# Patient Record
Sex: Male | Born: 1995 | Race: Black or African American | Hispanic: No | Marital: Single | Smoking: Never smoker
Health system: Southern US, Community
[De-identification: ages and names within clinical notes are randomized; demographics above are authoritative.]

## PROBLEM LIST (undated history)

## (undated) ENCOUNTER — Emergency Department: Payer: Self-pay | Source: Home / Self Care

## (undated) DIAGNOSIS — J45909 Unspecified asthma, uncomplicated: Secondary | ICD-10-CM

---
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
## 2017-11-23 ENCOUNTER — Encounter (HOSPITAL_COMMUNITY): Payer: Self-pay | Admitting: Emergency Medicine

## 2017-11-23 ENCOUNTER — Ambulatory Visit (HOSPITAL_COMMUNITY)
Admission: EM | Admit: 2017-11-23 | Discharge: 2017-11-23 | Disposition: A | Discharge status: Home / Self Care | Payer: Self-pay | Attending: Family | Admitting: Family

## 2017-11-23 DIAGNOSIS — R1084 Generalized abdominal pain: Secondary | ICD-10-CM

## 2017-11-23 MED ORDER — HYOSCYAMINE SULFATE 0.125 MG PO TABS: 0.1250 mg | ORAL_TABLET | Freq: Four times a day (QID) | ORAL | 0 refills | Status: DC | PRN

## 2017-11-23 NOTE — ED Triage Notes (Signed)
Pt c/o generalized abdominal pain since Monday. Denies n/v, states he had one episode of diarrhea. Pt denies issues with urination.

## 2017-11-23 NOTE — ED Provider Notes (Signed)
[facility]    CSN: [id number] Arrival date & time: 11/23/17  1205     History   Chief Complaint Chief Complaint  Patient presents with  . Abdominal Pain    HPI Peter Maxwell is a 22 y.o. male.   22 year old male presents with generalized abdominal pain for the past 4 to 5 days. Was worse yesterday evening after eating buttered popcorn. Denies any fever, nausea, vomiting, dysuria or URI symptoms. Had one episode of diarrhea but had a normal bowel movement yesterday. Symptoms improve slightly with eating except for last evening. Has not tried any medications for symptoms. No other chronic health issues except asthma. Takes no daily medications.   The history is provided by the patient.    Past Medical History:  Diagnosis Date  . Asthma     There are no active problems to display for this patient.   History reviewed. No pertinent surgical history.     Home Medications    Prior to Admission medications   Medication Sig Start Date End Date Taking? Authorizing Provider  hyoscyamine (LEVSIN, ANASPAZ) 0.125 MG tablet Take 1 tablet (0.125 mg total) by mouth every 6 (six) hours as needed for cramping. 11/23/17   Sudie Grumbling, NP    Family History No family history on file.  Social History Social History   Tobacco Use  . Smoking status: Never Smoker  . Smokeless tobacco: Never Used  Substance Use Topics  . Alcohol use: No  . Drug use: No     Allergies   Patient has no known allergies.   Review of Systems Review of Systems  Constitutional: Negative for activity change, appetite change, chills, fatigue and fever.  HENT: Negative for congestion, mouth sores, postnasal drip, rhinorrhea, sinus pressure, sinus pain, sneezing, sore throat and trouble swallowing.   Respiratory: Negative for cough, shortness of breath and wheezing.   Gastrointestinal: Positive for abdominal pain and diarrhea (one episode). Negative for blood in stool, constipation,  nausea and vomiting.  Genitourinary: Negative for decreased urine volume, difficulty urinating, discharge, dysuria, flank pain, genital sores, hematuria, scrotal swelling, testicular pain and urgency.  Musculoskeletal: Negative for arthralgias, back pain and myalgias.  Skin: Negative for rash and wound.  Allergic/Immunologic: Negative for immunocompromised state.  Neurological: Negative for dizziness, seizures, syncope, weakness, light-headedness and headaches.  Hematological: Negative for adenopathy. Does not bruise/bleed easily.  Psychiatric/Behavioral: Negative.      Physical Exam Triage Vital Signs ED Triage Vitals  Enc Vitals Group     BP 11/23/17 1237 (!) 105/58     Pulse Rate 11/23/17 1237 71     Resp 11/23/17 1237 16     Temp 11/23/17 1237 98.1 F (36.7 C)     Temp src --      SpO2 11/23/17 1237 100 %     Weight --      Height --      Head Circumference --      Peak Flow --      Pain Score 11/23/17 1238 8     Pain Loc --      Pain Edu? --      Excl. in GC? --    No data found.  Updated Vital Signs BP (!) 105/58   Pulse 71   Temp 98.1 F (36.7 C)   Resp 16   SpO2 100%   Visual Acuity Right Eye Distance:   Left Eye Distance:   Bilateral Distance:    Right Eye  Near:   Left Eye Near:    Bilateral Near:     Physical Exam  Constitutional: He is oriented to person, place, and time. He appears well-developed and well-nourished. He does not appear ill. No distress.  HENT:  Head: Normocephalic and atraumatic.  Right Ear: Hearing and external ear normal.  Left Ear: Hearing and external ear normal.  Nose: Nose normal.  Mouth/Throat: Uvula is midline, oropharynx is clear and moist and mucous membranes are normal.  Eyes: Pupils are equal, round, and reactive to light. Conjunctivae and EOM are normal. No scleral icterus.  Neck: Normal range of motion. Neck supple.  Cardiovascular: Normal rate, regular rhythm, normal heart sounds and intact distal pulses.  No  murmur heard. Pulmonary/Chest: Effort normal and breath sounds normal. No stridor. No respiratory distress. He has no wheezes. He has no rhonchi. He has no rales.  Abdominal: Soft. Normal appearance. He exhibits no distension, no fluid wave, no abdominal bruit, no pulsatile midline mass and no mass. Bowel sounds are increased. There is no hepatosplenomegaly. There is generalized tenderness. There is no rigidity, no rebound, no guarding and no CVA tenderness.  Musculoskeletal: Normal range of motion.  Lymphadenopathy:    He has no cervical adenopathy.  Neurological: He is alert and oriented to person, place, and time.  Skin: Skin is warm and dry. No rash noted.  Psychiatric: He has a normal mood and affect. His behavior is normal.  Vitals reviewed.    UC Treatments / Results  Labs (all labs ordered are listed, but only abnormal results are displayed) Labs Reviewed - No data to display  EKG None  Radiology No results found.  Procedures Procedures (including critical care time)  Medications Ordered in UC Medications - No data to display  Initial Impression / Assessment and Plan / UC Course  I have reviewed the triage vital signs and the nursing notes.  Pertinent labs & imaging results that were available during my care of the patient were reviewed by me and considered in my medical decision making (see chart for details).    Discussed that he may have a viral illness or mild irritable bowel. He declined any medication today but would like a written Rx if pain does not improve in the next 48 hours. If pain persists, may take Levsin as directed. Continue to increase fluids and eat a bland diet and advance as tolerated. Note written for work for today. Follow-up here in 3 days if not improving or go to the ER if symptoms worsen.   Final Clinical Impressions(s) / UC Diagnoses   Final diagnoses:  Generalized abdominal pain     Discharge Instructions     Recommend eat soft,  bland foods today. May increase fluid intake. Avoid fried or greasy foods. If pain continues or gets worse later today or tomorrow, may start Levsin 1 tablet every 6 hours as needed. Recommend follow-up here in 3 to 4 days if not improving or go to the ER if symptoms worsen.     ED Prescriptions    Medication Sig Dispense Auth. Provider   hyoscyamine (LEVSIN, ANASPAZ) 0.125 MG tablet Take 1 tablet (0.125 mg total) by mouth every 6 (six) hours as needed for cramping. 20 tablet Sudie Grumbling, NP     Controlled Substance Prescriptions [place] Controlled Substance Registry consulted? Not Applicable   Sudie Grumbling, NP 11/23/17 2228

## 2017-11-23 NOTE — Discharge Instructions (Addendum)
Recommend eat soft, bland foods today. May increase fluid intake. Avoid fried or greasy foods. If pain continues or gets worse later today or tomorrow, may start Levsin 1 tablet every 6 hours as needed. Recommend follow-up here in 3 to 4 days if not improving or go to the ER if symptoms worsen.

## 2017-12-11 ENCOUNTER — Ambulatory Visit: Payer: Medicaid - Out of State | Admitting: Family Medicine

## 2018-01-19 ENCOUNTER — Encounter (HOSPITAL_COMMUNITY): Payer: Self-pay | Admitting: Emergency Medicine

## 2018-01-19 ENCOUNTER — Emergency Department (HOSPITAL_COMMUNITY)
Admission: EM | Admit: 2018-01-19 | Discharge: 2018-01-19 | Disposition: A | Discharge status: Home / Self Care | Payer: Medicaid - Out of State | Attending: Emergency Medicine | Admitting: Emergency Medicine

## 2018-01-19 DIAGNOSIS — M79674 Pain in right toe(s): Secondary | ICD-10-CM | POA: Insufficient documentation

## 2018-01-19 DIAGNOSIS — J45909 Unspecified asthma, uncomplicated: Secondary | ICD-10-CM | POA: Insufficient documentation

## 2018-01-19 MED ORDER — IBUPROFEN 800 MG PO TABS: 800.0000 mg | ORAL_TABLET | Freq: Three times a day (TID) | ORAL | 0 refills | Status: DC

## 2018-01-19 NOTE — ED Notes (Signed)
Patient Alert and oriented to baseline. Stable and ambulatory to baseline. Patient verbalized understanding of the discharge instructions.  Patient belongings were taken by the patient.   

## 2018-01-19 NOTE — Discharge Instructions (Signed)
You were seen in the emergency department today for pain to your right second toe.  It appears that you have a callus.  Please take the ibuprofen 800 mg every 8 hours as needed for pain and swelling.  Take this with food as it can cause stomach upset and it were stomach bleeding.  Please take Tylenol per over-the-counter dosing with this medication.    We have prescribed you new medication(s) today. Discuss the medications prescribed today with your pharmacist as they can have adverse effects and interactions with your other medicines including over the counter and prescribed medications. Seek medical evaluation if you start to experience new or abnormal symptoms after taking one of these medicines, seek care immediately if you start to experience difficulty breathing, feeling of your throat closing, facial swelling, or rash as these could be indications of a more serious allergic reaction   Please wear loose fitting shoes or open toed shoes to avoid pressure to this area.  Follow-up with the podiatrist, foot specialist, your discharge instructions sometime next week.  Return to the ER for new or worsening symptoms or any other concerns.

## 2018-01-19 NOTE — ED Notes (Signed)
Patient is A&Ox4 at this time.  Patient in no signs of distress.  Please see providers note for complete history and physical exam.  

## 2018-01-19 NOTE — ED Provider Notes (Signed)
[facility] [id number] [facility] Provider Note   CSN: [id number] Arrival date & time: 01/19/18  2012     History   Chief Complaint Chief Complaint  Patient presents with  . Toe Pain    HPI Peter Maxwell is a 22 y.o. male with a hx of asthma who presents to the ED with complaints of r 2nd toe painful lesion x 1.5 weeks. Patient states that he wore a new pair of shoes just prior to onset of painful somewhat hard area to the medial aspect of his R 2nd toe. Rates pain a 10/10 in severity, worse with pressure to the area, no alleviating factors. No meds PTA. Denies fever, chills, change in color, numbness, or weakness.   HPI  Past Medical History:  Diagnosis Date  . Asthma     There are no active problems to display for this patient.   History reviewed. No pertinent surgical history.      Home Medications    Prior to Admission medications   Medication Sig Start Date End Date Taking? Authorizing Provider  hyoscyamine (LEVSIN, ANASPAZ) 0.125 MG tablet Take 1 tablet (0.125 mg total) by mouth every 6 (six) hours as needed for cramping. 11/23/17   Sudie GrumblingAmyot, Ann Berry, NP    Family History History reviewed. No pertinent family history.  Social History Social History   Tobacco Use  . Smoking status: Never Smoker  . Smokeless tobacco: Never Used  Substance Use Topics  . Alcohol use: No  . Drug use: No     Allergies   Patient has no known allergies.   Review of Systems Review of Systems  Constitutional: Negative for chills and fever.  Skin: Positive for wound. Negative for color change.  Neurological: Negative for weakness and numbness.     Physical Exam Updated Vital Signs BP 139/86 (BP Location: Right Arm)   Pulse 75   Temp 98.9 F (37.2 C) (Oral)   Resp 15   SpO2 100%   Physical Exam  Constitutional: He appears well-developed and well-nourished. No distress.  HENT:  Head: Normocephalic and atraumatic.  Eyes: Conjunctivae are  normal. Right eye exhibits no discharge. Left eye exhibits no discharge.  Cardiovascular:  Pulses:      Dorsalis pedis pulses are 2+ on the right side, and 2+ on the left side.       Posterior tibial pulses are 2+ on the right side, and 2+ on the left side.  Musculoskeletal:  Normal range of motion to bilateral ankles, able to move all digits (toes).  No point/focal bony tenderness.  Neurological: He is alert.  Clear speech.  Sensation grossly intact bilateral lower extremities.  5 out of 5 strength with plantar dorsiflexion bilaterally.  Gait intact.  Skin: Capillary refill takes less than 2 seconds.  Patient has a 0.5 cm area or thickened skin to medial aspect of R 2nd toe. Tender to palpation.  No overlying warmth.  No erythema.  No drainage.  No palpable fluctuance.  Image below.   Psychiatric: He has a normal mood and affect. His behavior is normal. Thought content normal.  Nursing note and vitals reviewed.      ED Treatments / Results  Labs (all labs ordered are listed, but only abnormal results are displayed) Labs Reviewed - No data to display  EKG None  Radiology No results found.  Procedures Procedures (including critical care time)  Medications Ordered in ED Medications - No data to display   Initial Impression / Assessment  and Plan / ED Course  I have reviewed the triage vital signs and the nursing notes.  Pertinent labs & imaging results that were available during my care of the patient were reviewed by me and considered in my medical decision making (see chart for details).   Patient presents with what appears to be painful callus to right second digit, NVI distally. Does not appear to have superimposed infection. Ibuprofen prescription provided, recommended loose fitting shoes, podiatry follow up. I discussed  treatment plan, follow-up, and return precautions with the patient. Provided opportunity for questions, patient confirmed understanding and is in  agreement with plan.    Final Clinical Impressions(s) / ED Diagnoses   Final diagnoses:  Pain of toe of right foot    ED Discharge Orders        Ordered    ibuprofen (ADVIL,MOTRIN) 800 MG tablet  3 times daily     01/19/18 2126       Cherly Anderson, PA-C 01/19/18 2131    Loren Racer, MD 01/22/18 1651

## 2018-01-19 NOTE — ED Triage Notes (Signed)
Pt presents to ED for assessment of large callous to second toe of his right foot after wearing a pair of tight shoes.  Patient states pain unbearable.

## 2018-04-02 ENCOUNTER — Ambulatory Visit: Payer: Medicaid - Out of State | Admitting: Family Medicine

## 2018-04-06 ENCOUNTER — Encounter (HOSPITAL_COMMUNITY): Payer: Self-pay

## 2018-04-06 ENCOUNTER — Other Ambulatory Visit: Payer: Self-pay

## 2018-04-06 ENCOUNTER — Ambulatory Visit (HOSPITAL_COMMUNITY)
Admission: EM | Admit: 2018-04-06 | Discharge: 2018-04-06 | Disposition: A | Discharge status: Home / Self Care | Payer: Self-pay | Attending: Internal Medicine | Admitting: Internal Medicine

## 2018-04-06 DIAGNOSIS — B360 Pityriasis versicolor: Secondary | ICD-10-CM

## 2018-04-06 MED ORDER — SELENIUM SULFIDE 2.25 % EX FOAM: 1.0000 "application " | Freq: Two times a day (BID) | CUTANEOUS | 0 refills | Status: DC

## 2018-04-06 NOTE — Discharge Instructions (Signed)
Symptoms most likely caused by fungal infection Selenium sulfide prescribed.  Use as directed and to completion Please be aware that changes in skin pigmentation can often persists even after successful treatment.  Restoration of normal skin pigmentation may take months after the completion of successful therapy.   Follow up with PCP or with [facility] if symptoms persists Return or go to the ED if you have any new or worsening symptoms

## 2018-04-06 NOTE — ED Provider Notes (Signed)
[facility]   [id number] 04/06/18 Arrival Time: 1550  CC: SKIN COMPLAINT  SUBJECTIVE:  Peter Maxwell is a 22 y.o. male who presents with a rash on neck that began 1 month ago.  Reports dying hair around the time of symptoms.  Denies changes in soaps, detergents, or anyone with similar symptoms.  Denies known trigger, environmental exposure or allergies, or recent travel.  Localizes the rash to right side of neck.  Denies itching or pain.  Has tried antifungal treatment with temporary relief.  Denies worsening symptoms.  Denies similar symptoms in the past.   Denies fever, chills, nausea, vomiting, erythema, redness, swollen glands, SOB, chest pain, abdominal pain, changes in bowel or bladder function.    ROS: As per HPI.  Past Medical History:  Diagnosis Date  . Asthma    History reviewed. No pertinent surgical history. No Known Allergies No current facility-administered medications on file prior to encounter.    Current Outpatient Medications on File Prior to Encounter  Medication Sig Dispense Refill  . hyoscyamine (LEVSIN, ANASPAZ) 0.125 MG tablet Take 1 tablet (0.125 mg total) by mouth every 6 (six) hours as needed for cramping. 20 tablet 0  . ibuprofen (ADVIL,MOTRIN) 800 MG tablet Take 1 tablet (800 mg total) by mouth 3 (three) times daily. 21 tablet 0   Social History   Socioeconomic History  . Marital status: Single    Spouse name: Not on file  . Number of children: Not on file  . Years of education: Not on file  . Highest education level: Not on file  Occupational History  . Not on file  Social Needs  . Financial resource strain: Not on file  . Food insecurity:    Worry: Not on file    Inability: Not on file  . Transportation needs:    Medical: Not on file    Non-medical: Not on file  Tobacco Use  . Smoking status: Never Smoker  . Smokeless tobacco: Never Used  Substance and Sexual Activity  . Alcohol use: No  . Drug use: No  . Sexual activity:  Not on file  Lifestyle  . Physical activity:    Days per week: Not on file    Minutes per session: Not on file  . Stress: Not on file  Relationships  . Social connections:    Talks on phone: Not on file    Gets together: Not on file    Attends religious service: Not on file    Active member of club or organization: Not on file    Attends meetings of clubs or organizations: Not on file    Relationship status: Not on file  . Intimate partner violence:    Fear of current or ex partner: Not on file    Emotionally abused: Not on file    Physically abused: Not on file    Forced sexual activity: Not on file  Other Topics Concern  . Not on file  Social History Narrative  . Not on file   History reviewed. No pertinent family history.  OBJECTIVE: Vitals:   04/06/18 1600 04/06/18 1603  BP: 109/67   Pulse: 73   Resp: 16   Temp: 97.9 F (36.6 C)   TempSrc: Oral   SpO2: 98%   Weight:  130 lb (59 kg)    General appearance: alert; no distress Lungs: clear to auscultation bilaterally Heart: regular rate and rhythm.  Radial pulse 2+ bilaterally Extremities: no edema Skin: warm and dry; flat  hypopigmented macules localized to right posterior neck and superior aspect of shoulder, with involvement of anterior trunk (see pictures below) Psychological: alert and cooperative; normal mood and affect        ASSESSMENT & PLAN:  1. Tinea versicolor     No orders of the defined types were placed in this encounter.  Symptoms most likely caused by fungal infection Selenium sulfide prescribed.  Use as directed and to completion Please be aware that changes in skin pigmentation can often persists even after successful treatment.  Restoration of normal skin pigmentation may take months after the completion of successful therapy.   Follow up with PCP or with [facility] if symptoms persists Return or go to the ED if you have any new or worsening symptoms   Reviewed  expectations re: course of current medical issues. Questions answered. Outlined signs and symptoms indicating need for more acute intervention. Patient verbalized understanding. After Visit Summary given.   Rennis Harding, PA-C 04/06/18 1649

## 2018-04-06 NOTE — ED Triage Notes (Signed)
Pt states he has a rash on his neck x 1 month.

## 2018-07-05 ENCOUNTER — Other Ambulatory Visit: Payer: Self-pay

## 2018-07-05 ENCOUNTER — Emergency Department (HOSPITAL_COMMUNITY): Payer: Self-pay

## 2018-07-05 ENCOUNTER — Emergency Department (HOSPITAL_COMMUNITY)
Admission: EM | Admit: 2018-07-05 | Discharge: 2018-07-05 | Disposition: A | Discharge status: Home / Self Care | Payer: Self-pay | Attending: Emergency Medicine | Admitting: Emergency Medicine

## 2018-07-05 DIAGNOSIS — J45909 Unspecified asthma, uncomplicated: Secondary | ICD-10-CM | POA: Insufficient documentation

## 2018-07-05 DIAGNOSIS — R1084 Generalized abdominal pain: Secondary | ICD-10-CM | POA: Insufficient documentation

## 2018-07-05 LAB — COMPREHENSIVE METABOLIC PANEL
ALBUMIN: 4.3 g/dL (ref 3.5–5.0)
ALT: 14 U/L (ref 0–44)
AST: 19 U/L (ref 15–41)
Alkaline Phosphatase: 42 U/L (ref 38–126)
Anion gap: 12 (ref 5–15)
BUN: 19 mg/dL (ref 6–20)
CHLORIDE: 101 mmol/L (ref 98–111)
CO2: 28 mmol/L (ref 22–32)
CREATININE: 1.19 mg/dL (ref 0.61–1.24)
Calcium: 10.1 mg/dL (ref 8.9–10.3)
GFR calc non Af Amer: >60 mL/min (ref >60)
GLUCOSE: 105 mg/dL — AB (ref 70–99)
Potassium: 3.4 mmol/L — ABNORMAL LOW (ref 3.5–5.1)
SODIUM: 141 mmol/L (ref 135–145)
Total Bilirubin: 0.9 mg/dL (ref 0.3–1.2)
Total Protein: 7.7 g/dL (ref 6.5–8.1)

## 2018-07-05 LAB — CBC WITH DIFFERENTIAL/PLATELET
ABS IMMATURE GRANULOCYTES: 0.01 10*3/uL (ref 0.00–0.07)
BASOS ABS: 0.1 10*3/uL (ref 0.0–0.1)
BASOS PCT: 1 %
Eosinophils Absolute: 0.2 10*3/uL (ref 0.0–0.5)
Eosinophils Relative: 4 %
HCT: 48.8 % (ref 39.0–52.0)
HEMOGLOBIN: 15.1 g/dL (ref 13.0–17.0)
Immature Granulocytes: 0 %
Lymphocytes Relative: 40 %
Lymphs Abs: 2.6 10*3/uL (ref 0.7–4.0)
MCH: 27.4 pg (ref 26.0–34.0)
MCHC: 30.9 g/dL (ref 30.0–36.0)
MCV: 88.4 fL (ref 80.0–100.0)
Monocytes Absolute: 1 10*3/uL (ref 0.1–1.0)
Monocytes Relative: 14 %
NEUTROS ABS: 2.8 10*3/uL (ref 1.7–7.7)
NEUTROS PCT: 41 %
PLATELETS: 293 10*3/uL (ref 150–400)
RBC: 5.52 MIL/uL (ref 4.22–5.81)
RDW: 13 % (ref 11.5–15.5)
WBC: 6.7 10*3/uL (ref 4.0–10.5)
nRBC: 0 % (ref 0.0–0.2)

## 2018-07-05 LAB — LIPASE, BLOOD: Lipase: 35 U/L (ref 11–51)

## 2018-07-05 MED ORDER — SODIUM CHLORIDE 0.9 % IV BOLUS
1000.0000 mL | Freq: Once | INTRAVENOUS | Status: AC
  Administered 2018-07-05: 1000 mL via INTRAVENOUS

## 2018-07-05 MED ORDER — DICYCLOMINE HCL 20 MG PO TABS: 20.0000 mg | ORAL_TABLET | Freq: Three times a day (TID) | ORAL | 0 refills | Status: DC

## 2018-07-05 MED ORDER — IOHEXOL 300 MG/ML  SOLN
100.0000 mL | Freq: Once | INTRAMUSCULAR | Status: AC | PRN
  Administered 2018-07-05: 100 mL via INTRAVENOUS

## 2018-07-05 MED ORDER — ONDANSETRON HCL 4 MG/2ML IJ SOLN
4.0000 mg | Freq: Once | INTRAMUSCULAR | Status: AC
  Administered 2018-07-05: 4 mg via INTRAVENOUS
  Filled 2018-07-05: qty 2

## 2018-07-05 MED ORDER — ONDANSETRON HCL 4 MG PO TABS: 4.0000 mg | ORAL_TABLET | Freq: Four times a day (QID) | ORAL | 0 refills | Status: DC

## 2018-07-05 MED ORDER — HYDROMORPHONE HCL 1 MG/ML IJ SOLN
1.0000 mg | Freq: Once | INTRAMUSCULAR | Status: AC
Start: 2018-07-05 — End: 2018-07-05
  Administered 2018-07-05: 1 mg via INTRAVENOUS
  Filled 2018-07-05: qty 1

## 2018-07-05 NOTE — ED Triage Notes (Addendum)
Patient c/o abd pain that began yesterday after "eating [employer]". Denies N/V. [facility]ased from [employer] 2 days ago - does not normally eat fast food.

## 2018-07-05 NOTE — ED Provider Notes (Signed)
[facility] EMERGENCY DEPARTMENT Provider Note   CSN: [id number] Arrival date & time: 07/05/18  4098     History   Chief Complaint Chief Complaint  Patient presents with  . Abdominal Pain    HPI AWS SHERE is a 22 y.o. male.  Patient presents for evaluation of abdominal pain.  Patient reports that pain started around noon today.  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  Approximately an hour later he started having abdominal pain.  Patient has been having severe cramping pain diffusely in the abdomen since.  He has not had nausea, vomiting, diarrhea.     Past Medical History:  Diagnosis Date  . Asthma     There are no active problems to display for this patient.   No past surgical history on file.      Home Medications    Prior to Admission medications   Medication Sig Start Date End Date Taking? Authorizing Provider  dicyclomine (BENTYL) 20 MG tablet Take 1 tablet (20 mg total) by mouth 3 (three) times daily before meals. 07/05/18   Gilda Crease, MD  ondansetron (ZOFRAN) 4 MG tablet Take 1 tablet (4 mg total) by mouth every 6 (six) hours. 07/05/18   Gilda Crease, MD    Family History No family history on file.  Social History Social History   Tobacco Use  . Smoking status: Never Smoker  . Smokeless tobacco: Never Used  Substance Use Topics  . Alcohol use: No  . Drug use: No     Allergies   Patient has no known allergies.   Review of Systems Review of Systems  Gastrointestinal: Positive for abdominal pain.  All other systems reviewed and are negative.    Physical Exam Updated Vital Signs BP 122/71   Pulse 72   Temp 97.6 F (36.4 C) (Oral)   Resp 18   Ht 5\' 10"  (1.778 m)   Wt 60.8 kg   SpO2 98%   BMI 19.23 kg/m   Physical Exam Vitals signs and nursing note reviewed.    Constitutional:      General: He is in acute distress.     Appearance: Normal appearance. He is well-developed.  HENT:     Head: Normocephalic and atraumatic.     Right Ear: Hearing normal.     Left Ear: Hearing normal.     Nose: Nose normal.  Eyes:     Conjunctiva/sclera: Conjunctivae normal.     Pupils: Pupils are equal, round, and reactive to light.  Neck:     Musculoskeletal: Normal range of motion and neck supple.  Cardiovascular:     Rate and Rhythm: Regular rhythm.     Heart sounds: S1 normal and S2 normal. No murmur. No friction rub. No gallop.   Pulmonary:     Effort: Pulmonary effort is normal. No respiratory distress.     Breath sounds: Normal breath sounds.  Chest:     Chest wall: No tenderness.  Abdominal:     General: Bowel sounds are normal.     Palpations: Abdomen is soft.     Tenderness: There is generalized abdominal tenderness. There is no guarding or rebound. Negative signs include Murphy's sign and McBurney's sign.     Hernia: No hernia is present.  Musculoskeletal: Normal range of motion.  Skin:    General:  Skin is warm and dry.     Findings: No rash.  Neurological:     Mental Status: He is alert and oriented to person, place, and time.     GCS: GCS eye subscore is 4. GCS verbal subscore is 5. GCS motor subscore is 6.     Cranial Nerves: No cranial nerve deficit.     Sensory: No sensory deficit.     Coordination: Coordination normal.  Psychiatric:        Speech: Speech normal.        Behavior: Behavior normal.        Thought Content: Thought content normal.      ED Treatments / Results  Labs (all labs ordered are listed, but only abnormal results are displayed) Labs Reviewed  COMPREHENSIVE METABOLIC PANEL - Abnormal; Notable for the following components:      Result Value   Potassium 3.4 (*)    Glucose, Bld 105 (*)    All other components within normal limits  CBC WITH DIFFERENTIAL/PLATELET  LIPASE, BLOOD    EKG None  Radiology Ct  Abdomen Pelvis W Contrast  Result Date: 07/05/2018 CLINICAL DATA:  22 y/o  M; abdominal pain beginning yesterday. EXAM: CT ABDOMEN AND PELVIS WITH CONTRAST TECHNIQUE: Multidetector CT imaging of the abdomen and pelvis was performed using the standard protocol following bolus administration of intravenous contrast. CONTRAST:  OMNIPAQUE IOHEXOL 300 MG/ML  SOLN COMPARISON:  None. FINDINGS: Lower chest: No acute abnormality. Hepatobiliary: No focal liver abnormality is seen. No gallstones, gallbladder wall thickening, or biliary dilatation. Pancreas: Unremarkable. No pancreatic ductal dilatation or surrounding inflammatory changes. Spleen: Normal in size without focal abnormality. Adrenals/Urinary Tract: Adrenal glands are unremarkable. Kidneys are normal, without renal calculi, focal lesion, or hydronephrosis. Bladder is unremarkable. Stomach/Bowel: Stomach is within normal limits. Appendix not identified. No evidence of bowel wall thickening, distention, or inflammatory changes. Vascular/Lymphatic: No significant vascular findings are present. No enlarged abdominal or pelvic lymph nodes. Reproductive: Prostate is unremarkable. Other: No abdominal wall hernia or abnormality. Small volume of ascites within the pelvis. Musculoskeletal: No fracture is seen. IMPRESSION: No acute process identified. Trace nonspecific ascites in the pelvis may represent mild occult underlying inflammatory process such as enteritis. Electronically Signed   By: Mitzi Hansen M.D.   On: 07/05/2018 06:21    Procedures Procedures (including critical care time)  Medications Ordered in ED Medications  sodium chloride 0.9 % bolus 1,000 mL (0 mLs Intravenous Stopped 07/05/18 0550)  HYDROmorphone (DILAUDID) injection 1 mg (1 mg Intravenous Given 07/05/18 0425)  ondansetron (ZOFRAN) injection 4 mg (4 mg Intravenous Given 07/05/18 0424)  iohexol (OMNIPAQUE) 300 MG/ML solution 100 mL (100 mLs Intravenous Contrast Given  07/05/18 0549)     Initial Impression / Assessment and Plan / ED Course  I have reviewed the triage vital signs and the nursing notes.  Pertinent labs & imaging results that were available during my care of the patient were reviewed by me and considered in my medical decision making (see chart for details).     Patient presents to the emergency department for evaluation of abdominal pain.  Patient reports pain began approximately 1 hour after eating fast food.  Patient has not been eating fast food for significant amount of time while he was in the [employer], was discharged 2 days ago.  Patient does not have vomiting or diarrhea associated with the symptoms.  Patient experiencing significant, acute and diffuse abdominal pain and tenderness.  Lab work unremarkable.  Patient underwent  CT scan to further evaluate, no acute findings other possibly enteritis.  Patient will require continued symptomatic treatment.  No further intervention is necessary.  Final Clinical Impressions(s) / ED Diagnoses   Final diagnoses:  Generalized abdominal pain    ED Discharge Orders         Ordered    dicyclomine (BENTYL) 20 MG tablet  3 times daily before meals     07/05/18 0651    ondansetron (ZOFRAN) 4 MG tablet  Every 6 hours     07/05/18 0652           Gilda CreasePollina,  J, MD 07/05/18 [phone]

## 2018-07-05 NOTE — ED Triage Notes (Signed)
Pt reports "severe severe abdominal pain" Pt states he ate fast food last night and stomach started hurting immediately after. Pt states pain is generalized, aching, 10/10. Denies N/V/D

## 2018-08-03 ENCOUNTER — Emergency Department (HOSPITAL_COMMUNITY): Payer: Self-pay

## 2018-08-03 ENCOUNTER — Emergency Department (HOSPITAL_COMMUNITY)
Admission: EM | Admit: 2018-08-03 | Discharge: 2018-08-03 | Disposition: A | Discharge status: Home / Self Care | Payer: Self-pay | Attending: Emergency Medicine | Admitting: Emergency Medicine

## 2018-08-03 DIAGNOSIS — J029 Acute pharyngitis, unspecified: Secondary | ICD-10-CM | POA: Insufficient documentation

## 2018-08-03 DIAGNOSIS — R51 Headache: Secondary | ICD-10-CM | POA: Insufficient documentation

## 2018-08-03 DIAGNOSIS — J069 Acute upper respiratory infection, unspecified: Secondary | ICD-10-CM | POA: Insufficient documentation

## 2018-08-03 DIAGNOSIS — M25532 Pain in left wrist: Secondary | ICD-10-CM | POA: Insufficient documentation

## 2018-08-03 MED ORDER — ACETAMINOPHEN 500 MG PO TABS
1000.0000 mg | ORAL_TABLET | Freq: Once | ORAL | Status: AC
  Administered 2018-08-03: 1000 mg via ORAL
  Filled 2018-08-03: qty 2

## 2018-08-03 NOTE — ED Notes (Signed)
Pt discharged from ED; instructions provided; Pt encouraged to return to ED if symptoms worsen and to f/u with PCP; Pt verbalized understanding of all instructions 

## 2018-08-03 NOTE — ED Triage Notes (Signed)
Pt having cough, runny nose and left wrist pain. No injury to wrist. Cold symptoms going on for 2 days. Wrist pain 9/10 for 2 days

## 2018-08-03 NOTE — ED Provider Notes (Signed)
[facility]   CSN: [id number] Arrival date & time: 08/03/18  2158     History   Chief Complaint Chief Complaint  Patient presents with  . Cough  . Migraine    HPI Peter Maxwell is a 23 y.o. male.  The history is provided by the patient. No language interpreter was used.  URI  Presenting symptoms: congestion, cough, rhinorrhea and sore throat   Presenting symptoms: no ear pain and no fever   Severity:  Mild Onset quality:  Gradual Duration:  3 days Timing:  Constant Progression:  Unchanged Chronicity:  New Relieved by:  Nothing Worsened by:  Nothing Ineffective treatments:  None tried Associated symptoms: headaches and myalgias ( L wrist)   Associated symptoms: no arthralgias, no neck pain and no wheezing   Risk factors: not elderly, no chronic cardiac disease, no chronic kidney disease, no chronic respiratory disease, no diabetes mellitus, no immunosuppression, no recent travel and no sick contacts    Patient also complains of left-sided dorsal wrist pain that began approximately 2 days ago.  He denies any falls or trauma.  Denies prior similar process.  Denies alleviating aggravating factors.  Past Medical History:  Diagnosis Date  . Asthma     There are no active problems to display for this patient.  No past surgical history on file.    Home Medications    Prior to Admission medications   Not on File    Family History No family history on file.  Social History Social History   Tobacco Use  . Smoking status: Never Smoker  . Smokeless tobacco: Never Used  Substance Use Topics  . Alcohol use: No  . Drug use: No     Allergies   Patient has no known allergies.   Review of Systems Review of Systems  Constitutional: Negative for chills and fever.  HENT: Positive for congestion, rhinorrhea and sore throat. Negative for ear pain.   Eyes: Negative for pain and visual disturbance.    Respiratory: Positive for cough. Negative for shortness of breath and wheezing.   Cardiovascular: Negative for chest pain and palpitations.  Gastrointestinal: Negative for abdominal pain and vomiting.  Genitourinary: Negative for dysuria and hematuria.  Musculoskeletal: Positive for myalgias ( L wrist). Negative for arthralgias, back pain and neck pain.  Skin: Negative for color change and rash.  Neurological: Positive for headaches. Negative for seizures and syncope.  All other systems reviewed and are negative.    Physical Exam Updated Vital Signs BP 108/74   Pulse 83   Temp 98.2 F (36.8 C) (Oral)   SpO2 98%   Physical Exam Vitals signs and nursing note reviewed.  Constitutional:      Appearance: Normal appearance. He is well-developed and normal weight.  HENT:     Head: Normocephalic and atraumatic.     Right Ear: External ear normal.     Left Ear: External ear normal.     Nose: Nose normal.     Mouth/Throat:     Mouth: Mucous membranes are moist.  Eyes:     Conjunctiva/sclera: Conjunctivae normal.  Neck:     Musculoskeletal: Neck supple. No neck rigidity.  Cardiovascular:     Rate and Rhythm: Normal rate and regular rhythm.     Heart sounds: No murmur.  Pulmonary:     Effort: Pulmonary effort is normal. No respiratory distress.     Breath sounds: Normal breath sounds.  Abdominal:  General: There is no distension.     Palpations: Abdomen is soft.     Tenderness: There is no abdominal tenderness.  Musculoskeletal:        General: No deformity.     Right lower leg: No edema.     Left lower leg: No edema.  Skin:    General: Skin is warm and dry.     Capillary Refill: Capillary refill takes less than 2 seconds.  Neurological:     General: No focal deficit present.     Mental Status: He is alert.     Cranial Nerves: No cranial nerve deficit.     Sensory: No sensory deficit.     Motor: No weakness.     Coordination: Coordination normal.    Patient is  able to flex and extend all digits in the left hand at the MCP, PIP, and DIP joints. Patient is able to flex and extend is the wrist and ulnar and radially deviate the wrist against resistance. No overlying skin changes or fluctuance. No snuffbox TTP.    ED Treatments / Results  Labs (all labs ordered are listed, but only abnormal results are displayed) Labs Reviewed - No data to display  EKG None  Radiology Dg Chest 2 View  Result Date: 08/03/2018 CLINICAL DATA:  23 y/o M; watery eyes, runny nose, dry cough for 3 days. EXAM: CHEST - 2 VIEW COMPARISON:  None. FINDINGS: The heart size and mediastinal contours are within normal limits. Both lungs are clear. The visualized skeletal structures are unremarkable. IMPRESSION: No acute pulmonary process identified. Electronically Signed   By: Mitzi Hansen M.D.   On: 08/03/2018 23:07    Procedures Procedures (including critical care time)  Medications Ordered in ED Medications  acetaminophen (TYLENOL) tablet 1,000 mg (1,000 mg Oral Given 08/03/18 2229)     Initial Impression / Assessment and Plan / ED Course  I have reviewed the triage vital signs and the nursing notes.  Pertinent labs & imaging results that were available during my care of the patient were reviewed by me and considered in my medical decision making (see chart for details).     Patient is a 23 year old male with a past medical history of asthma who presents for evaluation of cough, congestion, sore throat, headache, and left wrist pain.  On presentation patient is afebrile stable vital signs.  Exam as above remarkable for no focal neurological deficits, no neck stiffness, unremarkable oropharynx, and unremarkable left wrist.  Regarding the patient's URI symptoms impression is viral URI.  History exam is not consistent with sepsis, meningitis, mastoiditis, retropharyngeal abscess, peritonsillar abscess, Ludwig's angina, or other deep space infection of the head or  neck.  History exam is not consistent with pneumonia.  In addition chest x-ray does not show any focal consolidations or pneumothorax.  Regarding the patient's left wrist pain patient has full strength and sensation throughout the left upper extremity.  He has 2+ bilateral radial pulses.  History exam is not consistent with acute traumatic injury, cellulitis, or acute vascular injury.  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  Impression is viral URI.  Patient discharged in stable condition.  Strict return precautions advised and discussed.  Instructed follow-up PCP 3 to 5 days.  Final Clinical Impressions(s) / ED Diagnoses   Final diagnoses:  Viral upper respiratory tract infection    ED Discharge Orders    None  Antoine Primas, MD 08/03/18 4403    Gerhard Munch, MD 08/03/18 [phone]

## 2019-03-14 ENCOUNTER — Institutional Professional Consult (permissible substitution): Payer: Self-pay | Admitting: Pulmonary Disease

## 2019-04-22 ENCOUNTER — Institutional Professional Consult (permissible substitution): Payer: Self-pay | Admitting: Internal Medicine

## 2019-05-08 ENCOUNTER — Other Ambulatory Visit: Payer: Self-pay

## 2019-05-08 ENCOUNTER — Encounter: Payer: Self-pay | Admitting: Pulmonary Disease

## 2019-05-08 ENCOUNTER — Ambulatory Visit (INDEPENDENT_AMBULATORY_CARE_PROVIDER_SITE_OTHER): Payer: Self-pay | Admitting: Pulmonary Disease

## 2019-05-08 VITALS — BP 124/86 | HR 64 | Ht 71.0 in | Wt 134.0 lb

## 2019-05-08 DIAGNOSIS — Z8709 Personal history of other diseases of the respiratory system: Secondary | ICD-10-CM

## 2019-05-08 NOTE — Patient Instructions (Addendum)
Thank you for visiting Dr. Valeta Harms at [facility]. Today we recommend the following:  Orders Placed This Encounter  Procedures  . Pulmonary Function Test   We will call you with the results of your pulmonary function test.  We will also give a copy of these results to you for your records.  We will get you in with 1 of the advanced practice providers on Monday to review PFT and give you the results.   Please do your part to reduce the spread of COVID-19.

## 2019-05-08 NOTE — Progress Notes (Signed)
Synopsis: Referred in October 2020 as a self-referral for asthma by No ref. provider found  Subjective:   PATIENT ID: Peter Maxwell GENDER: male DOB: 04-25-1996, MRN: [id number]  Chief Complaint  Patient presents with  . Pulmonary Consult    PMH of asthma as childhood.  Patient and listed fall 2019 to the [employer].  This occurred through a recurring mechanism.  [place] was sent [place] [employer] [place].  Later he states that medical review of his past medical history while he was there revealed a childhood history of asthma.  Patient states that he has not received any asthma therapies since he was 23 years old.  He has not had any breathing complaints.  Patient denies nocturnal symptoms.  Patient does not have any wheeze.  Patient does not have any dyspnea on exertion.  He is able to complete all of his activities of daily living.  He is also able to participate in exercise that was necessary for him during his first few months within the [employer].  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  And requested pulmonary function test to be completed.  Therefore the patient made an appointment to see us today to have PFTs complete and evaluation of his lung function prior to return to the [employer].   Past Medical History:  Diagnosis Date  . Asthma      History reviewed. No pertinent family history.   Past Surgical History:  Procedure Laterality Date  . NO PAST SURGERIES      Social History   Socioeconomic History  . Marital status: Single    Spouse name: Not on file  . Number of children: Not on file  . Years of education: Not on file  . Highest education level: Not on file  Occupational History  . Not on file  Social Needs  . Financial resource strain: Not on file  . Food insecurity    Worry: Not on file    Inability: Not on file  . Transportation needs    Medical: Not on file    Non-medical: Not on file  Tobacco Use  . Smoking  status: Never Smoker  . Smokeless tobacco: Never Used  Substance and Sexual Activity  . Alcohol use: No  . Drug use: No  . Sexual activity: Not on file  Lifestyle  . Physical activity    Days per week: Not on file    Minutes per session: Not on file  . Stress: Not on file  Relationships  . Social Musicianconnections    Talks on phone: Not on file    Gets together: Not on file    Attends religious service: Not on file    Active member of club or organization: Not on file    Attends meetings of clubs or organizations: Not on file    Relationship status: Not on file  . Intimate partner violence    Fear of current or ex partner: Not on file    Emotionally abused: Not on file    Physically abused: Not on file    Forced sexual activity: Not on file  Other Topics Concern  . Not on file  Social History Narrative  . Not on file     No Known Allergies   No outpatient medications prior to visit.   No facility-administered medications prior to visit.     Review of Systems  Constitutional: Negative for chills, fever, malaise/fatigue and weight loss.  HENT: Negative for hearing loss, sore throat and tinnitus.   Eyes: Negative for blurred vision and double vision.  Respiratory: Negative for cough, hemoptysis, sputum production, shortness of breath, wheezing and stridor.   Cardiovascular: Negative for chest pain, palpitations, orthopnea, leg swelling and PND.  Gastrointestinal: Negative for abdominal pain, constipation, diarrhea, heartburn, nausea and vomiting.  Genitourinary: Negative for dysuria, hematuria and urgency.  Musculoskeletal: Negative for joint pain and myalgias.  Skin: Negative for itching and rash.  Neurological: Negative for dizziness, tingling, weakness and headaches.  Endo/Heme/Allergies: Negative for environmental allergies. Does not bruise/bleed easily.  Psychiatric/Behavioral: Negative for depression. The patient is not nervous/anxious and does not have insomnia.   All  other systems reviewed and are negative.    Objective:  Physical Exam Vitals signs reviewed.  Constitutional:      General: He is not in acute distress.    Appearance: He is well-developed.  HENT:     Head: Normocephalic and atraumatic.  Eyes:     General: No scleral icterus.    Conjunctiva/sclera: Conjunctivae normal.     Pupils: Pupils are equal, round, and reactive to light.  Neck:     Musculoskeletal: Neck supple.     Vascular: No JVD.     Trachea: No tracheal deviation.  Cardiovascular:     Rate and Rhythm: Normal rate and regular rhythm.     Heart sounds: Normal heart sounds. No murmur.  Pulmonary:     Effort: Pulmonary effort is normal. No tachypnea, accessory muscle usage or respiratory distress.     Breath sounds: Normal breath sounds. No stridor. No wheezing, rhonchi or rales.  Abdominal:     General: Bowel sounds are normal. There is no distension.     Palpations: Abdomen is soft.     Tenderness: There is no abdominal tenderness.  Musculoskeletal:        General: No tenderness.  Lymphadenopathy:     Cervical: No cervical adenopathy.  Skin:    General: Skin is warm and dry.     Capillary Refill: Capillary refill takes less than 2 seconds.     Findings: No rash.  Neurological:     Mental Status: He is alert and oriented to person, place, and time.  Psychiatric:        Behavior: Behavior normal.      Vitals:   05/08/19 0859  BP: 124/86  Pulse: 64  SpO2: 99%  Weight: 134 lb (60.8 kg)  Height: 5\' 11"  (1.803 m)   99% on RA BMI Readings from Last 3 Encounters:  05/08/19 18.69 kg/m  07/05/18 19.23 kg/m  04/06/18 18.13 kg/m   Wt Readings from Last 3 Encounters:  05/08/19 134 lb (60.8 kg)  07/05/18 134 lb (60.8 kg)  04/06/18 130 lb (59 kg)     CBC    Component Value Date/Time   WBC 6.7 07/05/2018 0407   RBC 5.52 07/05/2018 0407   HGB 15.1 07/05/2018 0407   HCT 48.8 07/05/2018 0407   PLT 293 07/05/2018 0407   MCV 88.4 07/05/2018 0407    MCH 27.4 07/05/2018 0407   MCHC 30.9 07/05/2018 0407   RDW 13.0 07/05/2018 0407   LYMPHSABS 2.6 07/05/2018 0407   MONOABS 1.0 07/05/2018 0407   EOSABS 0.2 07/05/2018 0407   BASOSABS 0.1 07/05/2018 0407    Chest Imaging: Chest x-ray 08/03/2018: No acute pulmonary process. The patient's images have been independently reviewed by me.    Pulmonary Functions Testing Results: No flowsheet data found.  FeNO: None  Pathology: None   Echocardiogram: None  Heart Catheterization: None    Assessment & Plan:     ICD-10-CM   1. History of asthma  Z87.09 Pulmonary Function Test    Discussion:  23 year old gentleman with a past medical history of asthma diagnosed in childhood.  He has not had any asthma related symptoms.  Never been admitted to the hospital for asthma.  Has not used any asthma therapy since the age of 31.  He currently has no symptomatology consistent with asthma.  Since he has no current symptoms there is no need for any type of medical intervention.  Due to him re-enlisting in the [employer] he is required to have pulmonary function test.  Plan: He will have preprocedural COVID testing followed by pulmonary function test on Monday. 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  Patient to return to the office on Monday for pulmonary function test  No current outpatient medications on file.   Garner Nash, DO [facility] 05/08/2019 9:26 AM

## 2019-05-09 ENCOUNTER — Other Ambulatory Visit: Payer: Self-pay

## 2019-05-09 ENCOUNTER — Other Ambulatory Visit (HOSPITAL_COMMUNITY): Admission: RE | Admit: 2019-05-09 | Payer: Self-pay | Source: Ambulatory Visit

## 2019-05-09 DIAGNOSIS — Z20822 Contact with and (suspected) exposure to covid-19: Secondary | ICD-10-CM

## 2019-05-10 LAB — NOVEL CORONAVIRUS, NAA: SARS-CoV-2, NAA: NOT DETECTED

## 2019-05-12 ENCOUNTER — Ambulatory Visit (INDEPENDENT_AMBULATORY_CARE_PROVIDER_SITE_OTHER): Payer: Self-pay | Admitting: Pulmonary Disease

## 2019-05-12 ENCOUNTER — Encounter: Payer: Self-pay | Admitting: Pulmonary Disease

## 2019-05-12 ENCOUNTER — Other Ambulatory Visit: Payer: Self-pay

## 2019-05-12 VITALS — BP 110/72 | HR 82 | Temp 97.2°F | Ht 72.0 in | Wt 135.8 lb

## 2019-05-12 DIAGNOSIS — Z8709 Personal history of other diseases of the respiratory system: Secondary | ICD-10-CM

## 2019-05-12 LAB — PULMONARY FUNCTION TEST
DL/VA % pred: 114 %
DL/VA: 5.88 ml/min/mmHg/L
DLCO unc % pred: 113 %
DLCO unc: 39.56 ml/min/mmHg
FEF 25-75 Post: 4.37 L/sec
FEF 25-75 Pre: 4.58 L/sec
FEF2575-%Change-Post: -4 %
FEF2575-%Pred-Post: 91 %
FEF2575-%Pred-Pre: 96 %
FEV1-%Change-Post: -1 %
FEV1-%Pred-Post: 93 %
FEV1-%Pred-Pre: 94 %
FEV1-Post: 3.97 L
FEV1-Pre: 4.05 L
FEV1FVC-%Change-Post: 0 %
FEV1FVC-%Pred-Pre: 102 %
FEV6-%Change-Post: -2 %
FEV6-%Pred-Post: 89 %
FEV6-%Pred-Pre: 92 %
FEV6-Post: 4.49 L
FEV6-Pre: 4.62 L
FEV6FVC-%Pred-Post: 100 %
FEV6FVC-%Pred-Pre: 100 %
FVC-%Change-Post: -1 %
FVC-%Pred-Post: 90 %
FVC-%Pred-Pre: 91 %
FVC-Post: 4.55 L
FVC-Pre: 4.62 L
Post FEV1/FVC ratio: 87 %
Post FEV6/FVC ratio: 100 %
Pre FEV1/FVC ratio: 88 %
Pre FEV6/FVC Ratio: 100 %
RV % pred: 139 %
RV: 2.16 L
TLC % pred: 95 %
TLC: 6.98 L

## 2019-05-12 NOTE — Progress Notes (Signed)
@Patient  ID: Peter Maxwell, male    DOB: 1995/09/21, 21 y.o.   MRN: [id number]  Chief Complaint  Patient presents with  . Follow-up    PFT today    Referring provider: No ref. provider found  HPI:  23 year old male self-referred to our office on 05/08/2019 for diagnosis of asthma.  Patient is currently listed in the [employer].  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  Smoker/ Smoking History: Never Smoker  Maintenance:  None Pt of: Dr. Tonia BroomsIcard  05/12/2019  - Visit   23 year old male presenting to our office today to complete a pulmonary function test.  Patient reports that he joined the [employer] in 2019.  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  This was delayed due to COVID-19.  Patient had childhood asthma but has not used an inhaler or had any sort of respiratory medications since he was [age].  He has no current acute issues at this time.  He does not have any maintenance inhalers.  He has no current complaints.  His pulmonary function testing today results are listed below:  05/12/2019-pulmonary function test-FVC 4.62 (91% predicted), postbronchodilator ratio 87, postbronchodilator FEV1 3.97 (93% predicted), no bronchodilator response, DLCO is hyperinflated 39.56 (113% predicted)  Tests:   05/12/2019-pulmonary function test-FVC 4.62 (91% predicted), postbronchodilator ratio 87, postbronchodilator FEV1 3.97 (93% predicted), no bronchodilator response, DLCO is hyperinflated 39.56 (113% predicted)   FENO:  No results found for: NITRICOXIDE  PFT: PFT Results Latest Ref Rng & Units 05/12/2019  FVC-Pre L 4.62  FVC-Predicted Pre % 91  FVC-Post L 4.55  FVC-Predicted Post % 90  Pre FEV1/FVC % % 88  Post FEV1/FCV % % 87  FEV1-Pre L 4.05  FEV1-Predicted Pre % 94  FEV1-Post L 3.97  DLCO UNC% % 113  DLCO COR  %Predicted % 114  TLC L 6.98  TLC % Predicted % 95  RV % Predicted % 139    WALK:  No flowsheet data found.  Imaging: No results found.  Lab Results:  CBC    Component Value Date/Time   WBC 6.7 07/05/2018 0407   RBC 5.52 07/05/2018 0407   HGB 15.1 07/05/2018 0407   HCT 48.8 07/05/2018 0407   PLT 293 07/05/2018 0407   MCV 88.4 07/05/2018 0407   MCH 27.4 07/05/2018 0407   MCHC 30.9 07/05/2018 0407   RDW 13.0 07/05/2018 0407   LYMPHSABS 2.6 07/05/2018 0407   MONOABS 1.0 07/05/2018 0407   EOSABS 0.2 07/05/2018 0407   BASOSABS 0.1 07/05/2018 0407    BMET    Component Value Date/Time   NA 141 07/05/2018 0407   K 3.4 (L) 07/05/2018 0407   CL 101 07/05/2018 0407   CO2 28 07/05/2018 0407   GLUCOSE 105 (H) 07/05/2018 0407   BUN 19 07/05/2018 0407   CREATININE 1.19 07/05/2018 0407   CALCIUM 10.1 07/05/2018 0407   GFRNONAA >60 07/05/2018 0407   GFRAA >60 07/05/2018 0407    BNP No results found for: BNP  ProBNP No results found for: PROBNP  Specialty Problems    None      No Known Allergies   There is no immunization history on file for this patient.  Past Medical History:  Diagnosis Date  . Asthma     Tobacco History: Social History   Tobacco Use  Smoking Status Never  Smoker  Smokeless Tobacco Never Used   Counseling given: Yes   Continue to not smoke  No outpatient encounter medications on file as of 05/12/2019.   No facility-administered encounter medications on file as of 05/12/2019.      Review of Systems  Review of Systems  Constitutional: Negative for activity change, chills, fatigue, fever and unexpected weight change.  HENT: Negative for postnasal drip, rhinorrhea, sinus pressure, sinus pain and sore throat.   Eyes: Negative.   Respiratory: Negative for cough, shortness of breath and wheezing.   Cardiovascular: Negative for chest pain and palpitations.  Gastrointestinal: Negative for constipation, diarrhea, nausea and vomiting.   Endocrine: Negative.   Genitourinary: Negative.   Musculoskeletal: Negative.   Skin: Negative.   Neurological: Negative for dizziness and headaches.  Psychiatric/Behavioral: Negative.  Negative for dysphoric mood. The patient is not nervous/anxious.   All other systems reviewed and are negative.    Physical Exam  BP 110/72   Pulse 82   Temp (!) 97.2 F (36.2 C) (Temporal)   Ht 6' (1.829 m)   Wt 135 lb 12.8 oz (61.6 kg)   SpO2 98%   BMI 18.42 kg/m   Wt Readings from Last 5 Encounters:  05/12/19 135 lb 12.8 oz (61.6 kg)  05/08/19 134 lb (60.8 kg)  07/05/18 134 lb (60.8 kg)  04/06/18 130 lb (59 kg)  05/07/16 128 lb 5 oz (58.2 kg) (15 %, Z= -1.02)*   * Growth percentiles are based on CDC (Boys, 2-20 Years) data.    BMI Readings from Last 5 Encounters:  05/12/19 18.42 kg/m  05/08/19 18.69 kg/m  07/05/18 19.23 kg/m  04/06/18 18.13 kg/m  05/07/16 17.90 kg/m (3 %, Z= -1.92)*   * Growth percentiles are based on CDC (Boys, 2-20 Years) data.    Physical Exam Vitals signs and nursing note reviewed.  Constitutional:      General: He is not in acute distress.    Appearance: Normal appearance. He is normal weight.  HENT:     Head: Normocephalic.     Right Ear: Hearing and external ear normal.     Left Ear: Hearing and external ear normal.     Nose: No mucosal edema.     Right Turbinates: Not enlarged.     Left Turbinates: Not enlarged.     Mouth/Throat:     Mouth: Mucous membranes are dry.     Pharynx: Oropharynx is clear. No oropharyngeal exudate.  Eyes:     Pupils: Pupils are equal, round, and reactive to light.  Neck:     Musculoskeletal: Normal range of motion.  Cardiovascular:     Rate and Rhythm: Normal rate and regular rhythm.     Pulses: Normal pulses.     Heart sounds: Normal heart sounds. No murmur.  Pulmonary:     Effort: Pulmonary effort is normal. No respiratory distress.     Breath sounds: Normal breath sounds. No decreased breath sounds,  wheezing or rales.  Musculoskeletal:     Right lower leg: No edema.     Left lower leg: No edema.  Lymphadenopathy:     Cervical: No cervical adenopathy.  Skin:    General: Skin is warm and dry.     Capillary Refill: Capillary refill takes less than 2 seconds.     Findings: No erythema or rash.  Neurological:     General: No focal deficit present.     Mental Status: He is alert and oriented to person, place, and time.  Motor: No weakness.     Coordination: Coordination normal.     Gait: Gait is intact. Gait normal.  Psychiatric:        Mood and Affect: Mood normal.        Behavior: Behavior normal. Behavior is cooperative.        Thought Content: Thought content normal.        Judgment: Judgment normal.       Assessment & Plan:   History of asthma Plan: Okay to resume [employer] activities Normal pulmonary function testing today Follow-up with our office as needed Walk today in office, patient was able to tolerate 2 laps without any acute issues    Return if symptoms worsen or fail to improve, for Follow up with Dr. Tonia Brooms.   Coral Ceo, NP 05/12/2019   This appointment was 26 minutes long with over 50% of the time in direct face-to-face patient care, assessment, plan of care, and follow-up.

## 2019-05-12 NOTE — Assessment & Plan Note (Signed)
Plan: Okay to resume [employer] activities Normal pulmonary function testing today Follow-up with our office as needed Walk today in office, patient was able to tolerate 2 laps without any acute issues

## 2019-05-12 NOTE — Progress Notes (Signed)
Full PFT performed today. °

## 2019-05-12 NOTE — Patient Instructions (Addendum)
You were seen today by Lauraine Rinne, NP  for:   1. History of asthma  Normal pulmonary function testing today  I believe it is safe for you to go back and resume [employer] activities  You can follow-up with our office as needed   Follow Up:    Return if symptoms worsen or fail to improve, for Follow up with Dr. Valeta Harms.   Please do your part to reduce the spread of COVID-19:      Reduce your risk of any infection  and COVID19 by using the similar precautions used for avoiding the common cold or flu:  Marland Kitchen Wash your hands often with soap and warm water for at least 20 seconds.  If soap and water are not readily available, use an alcohol-based hand sanitizer with at least 60% alcohol.  . If coughing or sneezing, cover your mouth and nose by coughing or sneezing into the elbow areas of your shirt or coat, into a tissue or into your sleeve (not your hands). Langley Gauss A MASK when in public  . Avoid shaking hands with others and consider head nods or verbal greetings only. . Avoid touching your eyes, nose, or mouth with unwashed hands.  . Avoid close contact with people who are sick. . Avoid places or events with large numbers of people in one location, like concerts or sporting events. . If you have some symptoms but not all symptoms, continue to monitor at home and seek medical attention if your symptoms worsen. . If you are having a medical emergency, call 911.   [place] / e-Visit: [email or web address]         [facility]: [facility]: [phone]                   [facility]: [phone]     It is flu season:   >>> Best ways to protect herself from the flu: Receive the yearly flu vaccine, practice good hand hygiene washing with soap and also using hand sanitizer when available, eat a nutritious meals, get adequate rest, hydrate  appropriately   Please contact the office if your symptoms worsen or you have concerns that you are not improving.   Thank you for choosing [facility] for your healthcare, and for allowing [place] to partner with you on your healthcare journey. I am thankful to be able to provide care to you today.   Wyn Quaker FNP-C

## 2019-06-06 ENCOUNTER — Encounter: Payer: Self-pay | Admitting: Pharmacist

## 2019-06-06 ENCOUNTER — Telehealth: Payer: Self-pay | Admitting: Pharmacy Technician

## 2019-06-06 NOTE — Telephone Encounter (Signed)
[facility] Patient Advocate Encounter    Findings of the benefits investigation conducted this morning via test claims for the patient's upcoming appointment on 11/13 are as follows:   Insurance: no coverage found  [facility] Patient Advocate will follow up once patient arrives for their appointment to confirm these findings are accurate and can get the patient enrolled in [facility] program once labs return.  Bartholomew Crews, CPhT Specialty Pharmacy Patient [facility] Phone: [phone] Fax: [phone] 06/06/2019 8:31 AM

## 2019-07-25 ENCOUNTER — Emergency Department (HOSPITAL_COMMUNITY)
Admission: EM | Admit: 2019-07-25 | Discharge: 2019-07-26 | Disposition: A | Discharge status: Home / Self Care | Payer: Self-pay | Attending: Emergency Medicine | Admitting: Emergency Medicine

## 2019-07-25 ENCOUNTER — Other Ambulatory Visit: Payer: Self-pay

## 2019-07-25 ENCOUNTER — Encounter (HOSPITAL_COMMUNITY): Payer: Self-pay | Admitting: *Deleted

## 2019-07-25 DIAGNOSIS — K6289 Other specified diseases of anus and rectum: Secondary | ICD-10-CM | POA: Insufficient documentation

## 2019-07-25 DIAGNOSIS — K5900 Constipation, unspecified: Secondary | ICD-10-CM | POA: Insufficient documentation

## 2019-07-25 DIAGNOSIS — J45909 Unspecified asthma, uncomplicated: Secondary | ICD-10-CM | POA: Insufficient documentation

## 2019-07-25 MED ORDER — HYDROCORTISONE (PERIANAL) 2.5 % EX CREA: 1.0000 "application " | TOPICAL_CREAM | Freq: Two times a day (BID) | CUTANEOUS | 0 refills | Status: AC

## 2019-07-25 NOTE — ED Provider Notes (Signed)
[facility] EMERGENCY DEPARTMENT Provider Note   CSN: [id number] Arrival date & time: 07/25/19  1911     History Chief Complaint  Patient presents with  . Rectal Pain    Peter Maxwell is a 24 y.o. male who presents to the ED today complaining of gradual onset, constant, rectal pain x 4 days. Pt endorses he had a large than normal bowel movement 4 days ago and had to strain while having it. He reports the next day he woke up with pain to his rectum that has been present since. He reports the pain is alleviated with taking a hot shower but is otherwise present. It is exacerbated with attempting to have a bowel movement. Pt reports he has been in so much pain with attempting to have a movement that he has not gone to the bathroom since. He has tried sitz baths and hemorrhoid suppository without relief. Pt denies fever, chills, abdominal pain, nausea, vomiting, shortness of breath, or any other associated symptoms. Pt is sexually active with males; reports he has not had intercourse in over a month. Pt still passing gas.   The history is provided by the patient.       Past Medical History:  Diagnosis Date  . Asthma     Patient Active Problem List   Diagnosis Date Noted  . History of asthma 05/12/2019    Past Surgical History:  Procedure Laterality Date  . NO PAST SURGERIES         No family history on file.  Social History   Tobacco Use  . Smoking status: Never Smoker  . Smokeless tobacco: Never Used  Substance Use Topics  . Alcohol use: No  . Drug use: No    Home Medications Prior to Admission medications   Medication Sig Start Date End Date Taking? Authorizing Provider  hydrocortisone (ANUSOL-HC) 2.5 % rectal cream Place 1 application rectally 2 (two) times daily. 07/25/19   Tanda Rockers, PA-C    Allergies    Patient has no known allergies.  Review of Systems   Review of Systems  Constitutional: Negative for chills and fever.    Gastrointestinal: Positive for constipation and rectal pain. Negative for abdominal pain, nausea and vomiting.  All other systems reviewed and are negative.   Physical Exam Updated Vital Signs BP 115/73 (BP Location: Right Arm)   Pulse 74   Temp 98.2 F (36.8 C) (Oral)   Resp 16   SpO2 100%   Physical Exam Vitals and nursing note reviewed.  Constitutional:      Appearance: He is not ill-appearing.  HENT:     Head: Normocephalic and atraumatic.  Eyes:     Conjunctiva/sclera: Conjunctivae normal.  Cardiovascular:     Rate and Rhythm: Normal rate and regular rhythm.     Pulses: Normal pulses.  Pulmonary:     Effort: Pulmonary effort is normal.     Breath sounds: Normal breath sounds. No wheezing, rhonchi or rales.  Genitourinary:    Comments: Chaperone present for exam. No external hemorrhoids appreciated. No anal fissure or fistula. No perirectal abscess. No fecal impaction with DRE. No palpable hemorrhoids on DRE. Pt unable to tolerate much of rectal exam due to pain.  Skin:    General: Skin is warm and dry.     Coloration: Skin is not jaundiced.  Neurological:     Mental Status: He is alert.     ED Results / Procedures / Treatments   Labs (all labs  ordered are listed, but only abnormal results are displayed) Labs Reviewed - No data to display  EKG None  Radiology No results found.  Procedures Procedures (including critical care time)  Medications Ordered in ED Medications - No data to display  ED Course  I have reviewed the triage vital signs and the nursing notes.  Pertinent labs & imaging results that were available during my care of the patient were reviewed by me and considered in my medical decision making (see chart for details).  24 year old male with rectal pain after having larger than normal bowel movement 4 days ago. Has not had a bowel movement since due to pain and fear of pain. Does not take miralax or other stool softeners. On exam there are  no external issues palpated including abscess, fissure, fistula, or hemorrhoid. Suspect pt may have some microtears to the inner aspect of his rectum causing pain. He is nontoxic appearing today. No fever. Pt nontachycardic and nontachypneic. Doubt infectious etiology. Will prescribe rectal hydrocortisone cream and encouraged miralax and increase water/fiber in diet. Pt to return if any worsening pain or inability to have a bowel movement after another 2 days or N/V. Otherwise pt to follow up with PCP. Pt is in agreement with plan and stable for discharge home.     MDM Rules/Calculators/A&P                       Final Clinical Impression(s) / ED Diagnoses Final diagnoses:  Rectal pain    Rx / DC Orders ED Discharge Orders         Ordered    hydrocortisone (ANUSOL-HC) 2.5 % rectal cream  2 times daily     07/25/19 2114           Discharge Instructions     Please pick up Miralax and take as indicated daily. Increase your water intake and your fiber intake to help with your  bowel movements.   Use hydrocortisone cream as prescribed to help with the pain.   Follow up with your PCP. If you do not have one you can follow up with [facility] for your primary care needs.   Return to the ED for any worsening symptoms including worsening pain, inability to how a bowel movement after the above mentioned remedies, abdominal pain, fevers.        Eustaquio Maize, PA-C 07/25/19 2249    Tegeler, Gwenyth Allegra, MD 07/28/19 [phone]

## 2019-07-25 NOTE — Discharge Instructions (Addendum)
Please pick up Miralax and take as indicated daily. Increase your water intake and your fiber intake to help with your  bowel movements.   Use hydrocortisone cream as prescribed to help with the pain.   Follow up with your PCP. If you do not have one you can follow up with [facility] for your primary care needs.   Return to the ED for any worsening symptoms including worsening pain, inability to how a bowel movement after the above mentioned remedies, abdominal pain, fevers.

## 2019-07-25 NOTE — ED Triage Notes (Signed)
Pt reporting he had his last  bowel movement 4 days ago, and since then has had rectal pain since then. Not taking any medications for constipation.

## 2020-05-17 IMAGING — CT CT ABD-PELV W/ CM
2 of 4 series · 16 of 46 positions shown, 18 images · IV contrast (omnipaque)
Comparison: None.

CLINICAL DATA: 22 y/o  M; abdominal pain beginning yesterday.

EXAM:
CT ABDOMEN AND PELVIS WITH CONTRAST
TECHNIQUE: Multidetector CT imaging of the abdomen and pelvis was performed
using the standard protocol following bolus administration of
intravenous contrast.
CONTRAST:  100mL OMNIPAQUE IOHEXOL 300 MG/ML  SOLN

[Series 3: a/p w/o 5mm · axial · non-contrast · 0.66mm/px · z∈[+694,+1069]mm · 13 of 83 slices shown, 15 images]
[im 4/83  soft-tissue]
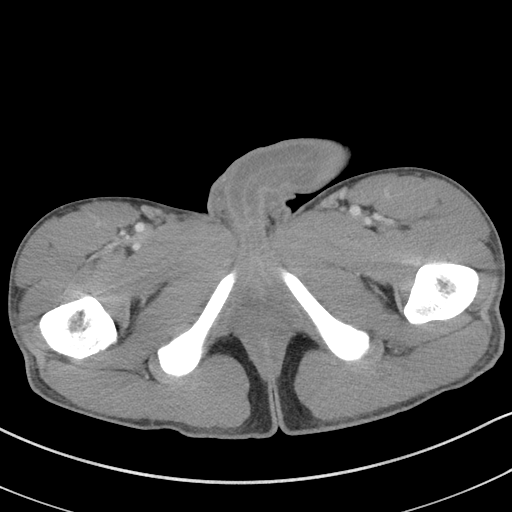
[im 4/83  bone]
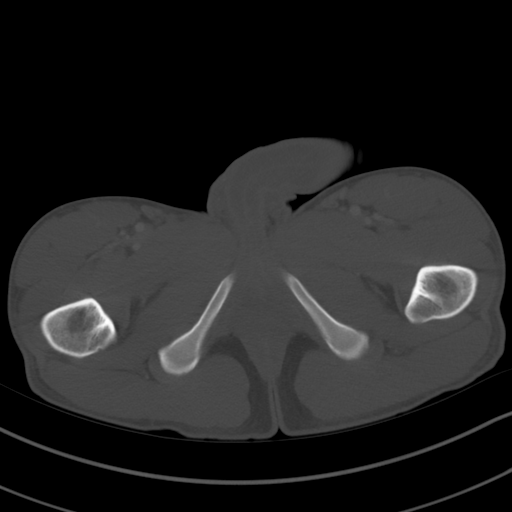
[im 10/83  soft-tissue]
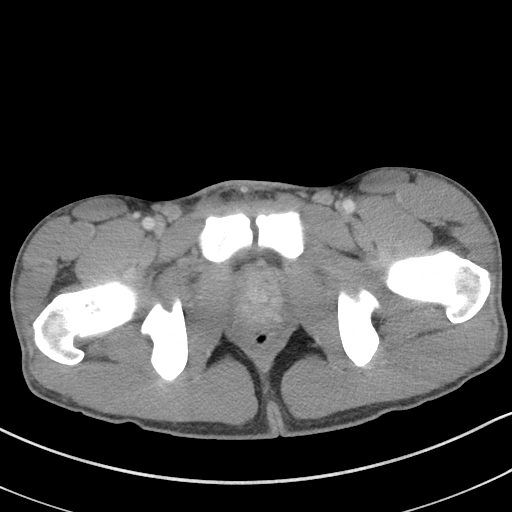
[im 17/83  soft-tissue]
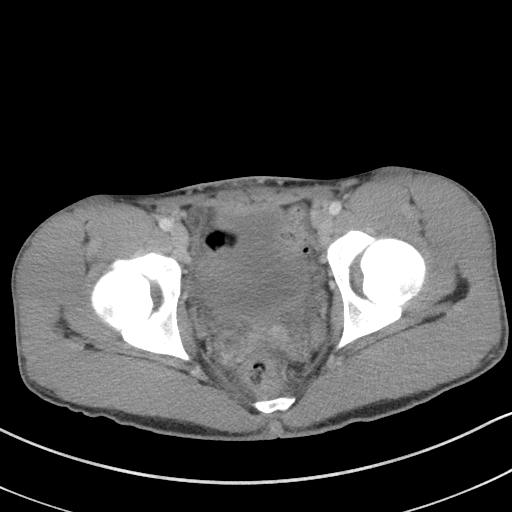
[im 23/83  soft-tissue]
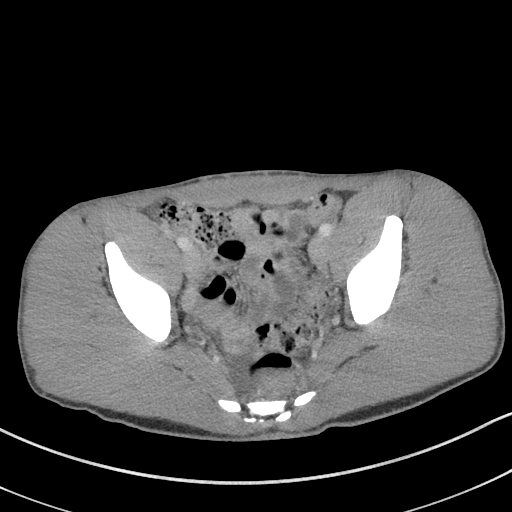
[im 30/83  soft-tissue]
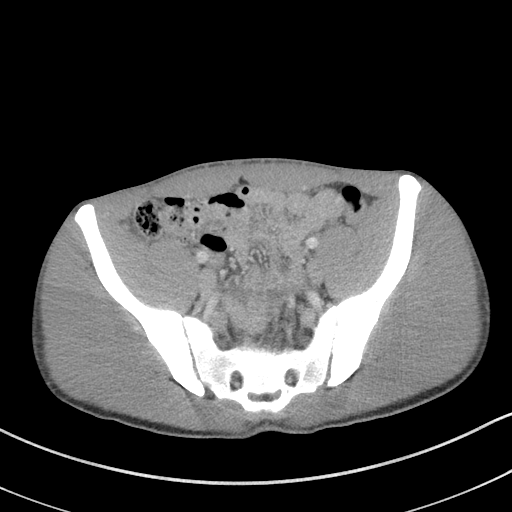
[im 37/83  soft-tissue]
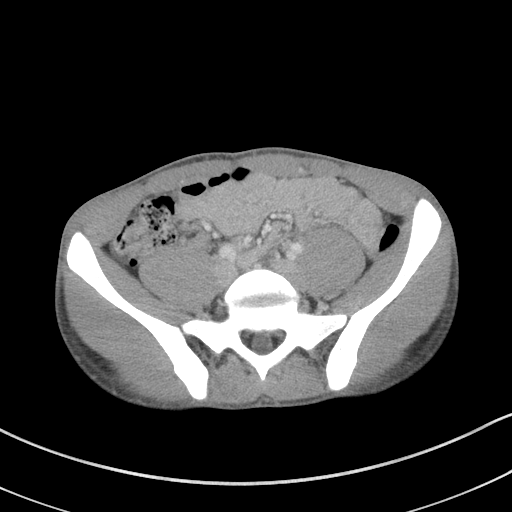
[im 43/83  soft-tissue]
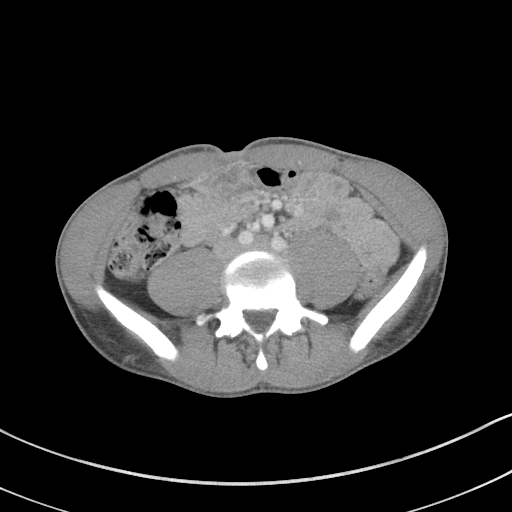
[im 46/83  soft-tissue]
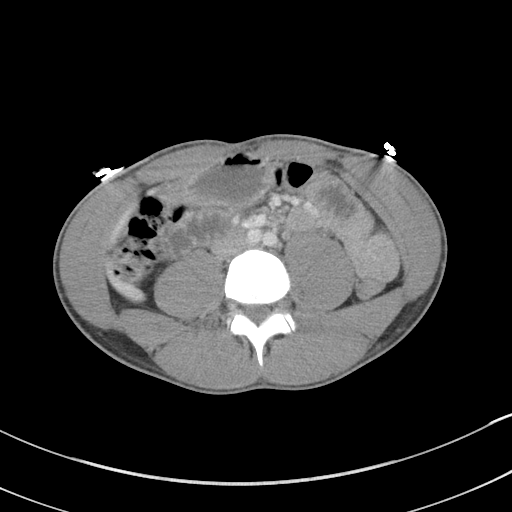
[im 53/83  soft-tissue]
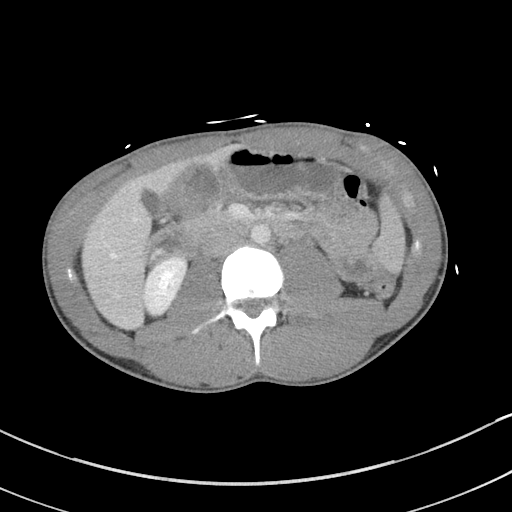
[im 53/83  bone]
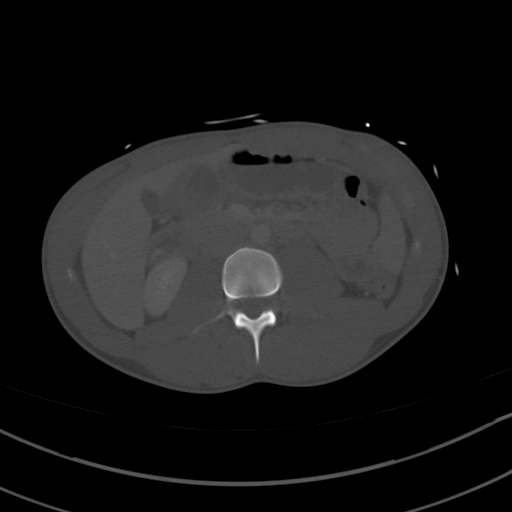
[im 60/83  soft-tissue]
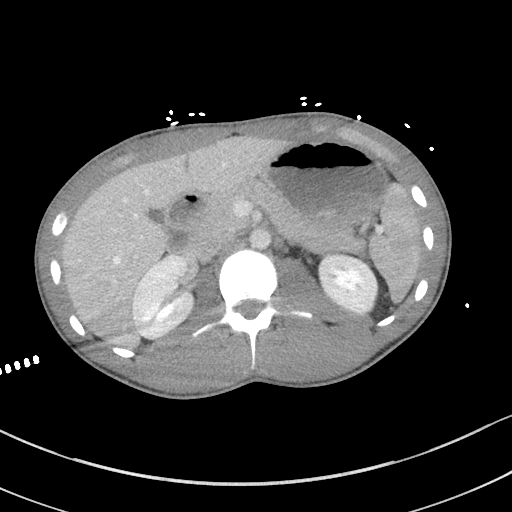
[im 66/83  soft-tissue]
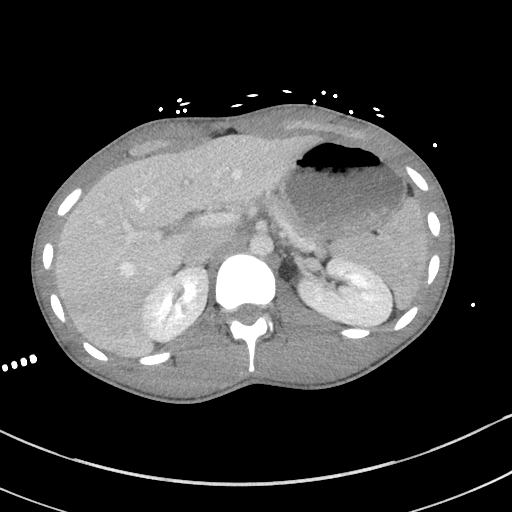
[im 73/83  soft-tissue]
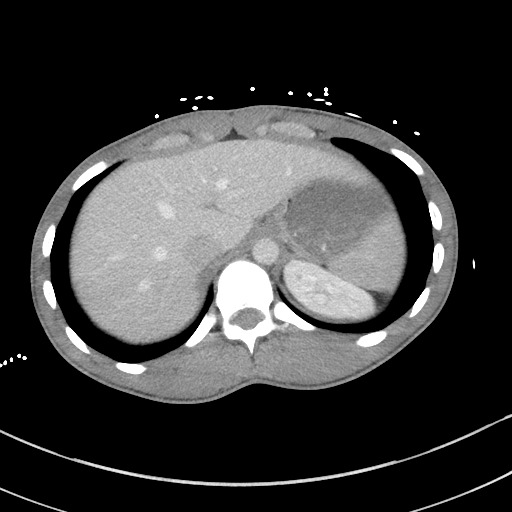
[im 79/83  soft-tissue]
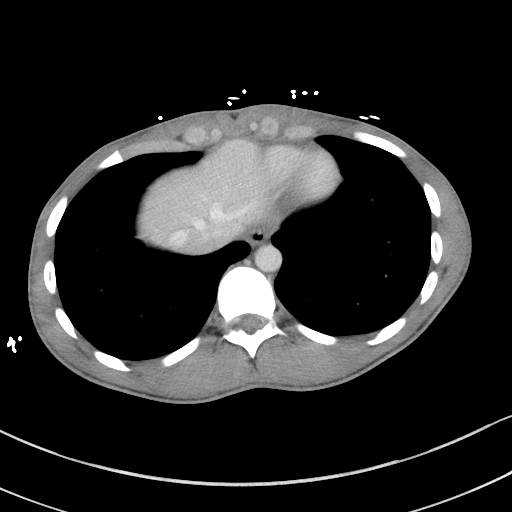

[Series 6: a/p w/o cor · coronal · non-contrast · 0.65mm/px · 3 of 142 slices shown]
[im 48/142  soft-tissue]
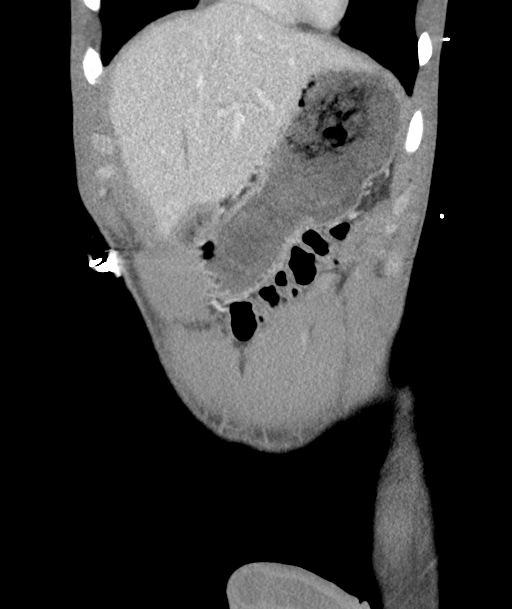
[im 63/142  soft-tissue]
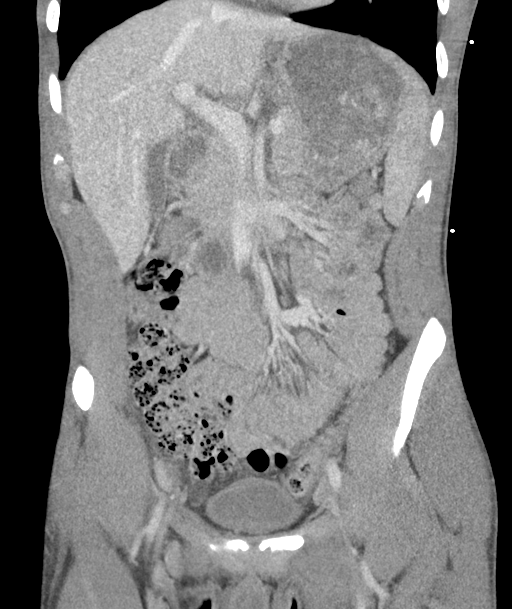
[im 79/142  soft-tissue]
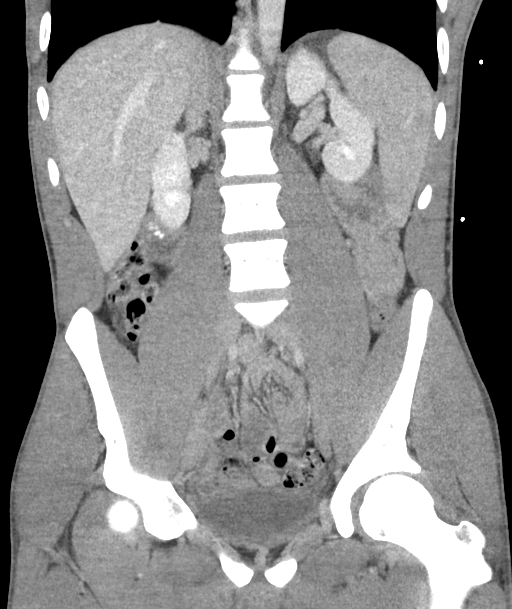

[16 of 46 positions shown; findings below may reference images not displayed]

FINDINGS: Lower chest: No acute abnormality.

Hepatobiliary: No focal liver abnormality is seen. No gallstones,
gallbladder wall thickening, or biliary dilatation.

Pancreas: Unremarkable. No pancreatic ductal dilatation or
surrounding inflammatory changes.

Spleen: Normal in size without focal abnormality.

Adrenals/Urinary Tract: Adrenal glands are unremarkable. Kidneys are
normal, without renal calculi, focal lesion, or hydronephrosis.
Bladder is unremarkable.

Stomach/Bowel: Stomach is within normal limits. Appendix not
identified. No evidence of bowel wall thickening, distention, or
inflammatory changes.

Vascular/Lymphatic: No significant vascular findings are present. No
enlarged abdominal or pelvic lymph nodes.

Reproductive: Prostate is unremarkable.

Other: No abdominal wall hernia or abnormality. Small volume of
ascites within the pelvis.

Musculoskeletal: No fracture is seen.
IMPRESSION: No acute process identified. Trace nonspecific ascites in the pelvis
may represent mild occult underlying inflammatory process such as
enteritis.
